# Patient Record
Sex: Male | Born: 2000 | Race: White | Hispanic: No | Marital: Single | Smoking: Never smoker
Health system: Southern US, Community
[De-identification: ages and names within clinical notes are randomized; demographics above are authoritative.]

## PROBLEM LIST (undated history)

## (undated) DIAGNOSIS — R110 Nausea: Secondary | ICD-10-CM

## (undated) DIAGNOSIS — R1110 Vomiting, unspecified: Secondary | ICD-10-CM

## (undated) HISTORY — DX: Nausea: R11.0

## (undated) HISTORY — DX: Vomiting, unspecified: R11.10

---
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
## 2020-08-25 ENCOUNTER — Ambulatory Visit
Admission: EM | Admit: 2020-08-25 | Discharge: 2020-08-25 | Disposition: A | Discharge status: Home / Self Care | Payer: Medicaid Other | Attending: Sports Medicine | Admitting: Sports Medicine

## 2020-08-25 ENCOUNTER — Other Ambulatory Visit: Payer: Self-pay

## 2020-08-25 DIAGNOSIS — G8929 Other chronic pain: Secondary | ICD-10-CM

## 2020-08-25 DIAGNOSIS — M25572 Pain in left ankle and joints of left foot: Secondary | ICD-10-CM

## 2020-08-25 DIAGNOSIS — S0011XA Contusion of right eyelid and periocular area, initial encounter: Secondary | ICD-10-CM

## 2020-08-25 DIAGNOSIS — S0083XA Contusion of other part of head, initial encounter: Secondary | ICD-10-CM | POA: Diagnosis not present

## 2020-08-25 DIAGNOSIS — S0033XA Contusion of nose, initial encounter: Secondary | ICD-10-CM

## 2020-08-25 DIAGNOSIS — S5011XA Contusion of right forearm, initial encounter: Secondary | ICD-10-CM
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
